# Patient Record
Sex: Male | Born: 1985 | Race: White | Hispanic: No | Marital: Single | Smoking: Never smoker
Health system: Southern US, Community
[De-identification: ages and names within clinical notes are randomized; demographics above are authoritative.]

---

## 2017-04-24 NOTE — Progress Notes (Signed)
Subjective:    Patient ID: Caleb Frye, male    DOB: 02/28/1986, 32 y.o.   MRN: [id number]  Chief Complaint  Patient presents with  . New Patient (Initial Visit)    no appetite and weak all the time    HPI:  Caleb Frye is a 32 y.o. male who presents today for initial evaluation and treatment of Hepatitis C.   Caleb Frye tested positive for Hepatitis C with a positive antibody test on 11/10/2016. Previously tested positive about 5 years ago and did not seek treatment secondary to insurance. 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 He has spent time in prison recently. Available lab work did not include a RNA level or gentotype. He has not sought any treatment prior to today. His mother has been diagnosed with cirrhosis secondary to alcohol. No personal history of liver issues. Immunization status for Hepatitis A and B is currently unknown. He currently works as a Psychologist, occupational. Denies any tattoos.    No Known Allergies    Outpatient Medications Prior to Visit  Medication Sig Dispense Refill  . Ascorbic Acid (VITAMIN C) 100 MG tablet Take 100 mg by mouth daily.    Marland Kitchen b complex vitamins tablet Take 1 tablet by mouth daily.    . Multiple Vitamin (MULTIVITAMIN) capsule Take 1 capsule by mouth daily.     No facility-administered medications prior to visit.      History reviewed. No pertinent past medical history.    History reviewed. No pertinent surgical history.    Family History  Problem Relation Age of Onset  . Bipolar disorder Mother   . Hypertension Father   . Hypertension Paternal Grandmother       Social History   Socioeconomic History  . Marital status: Single    Spouse name: Not on file  . Number of children: 0  . Years of education: 18  . Highest education level: Not on file  Social Needs  . Financial resource strain: Not on file  . Food insecurity - worry: Not on file  . Food insecurity - inability: Not on file  . Transportation needs - medical:  Not on file  . Transportation needs - non-medical: Not on file  Occupational History  . Not on file  Tobacco Use  . Smoking status: Never Smoker  . Smokeless tobacco: Never Used  Substance and Sexual Activity  . Alcohol use: No    Frequency: Never  . Drug use: Yes    Frequency: 7.0 times per week    Types: Marijuana  . Sexual activity: Yes    Comment: given condoms  Other Topics Concern  . Not on file  Social History Narrative  . Not on file      Review of Systems  Constitutional: Positive for unexpected weight change. Negative for chills and fever.  Respiratory: Negative for chest tightness, shortness of breath and wheezing.   Cardiovascular: Negative for chest pain and leg swelling.  Gastrointestinal: Negative for abdominal distention, abdominal pain, blood in stool, constipation, diarrhea, nausea and vomiting.  Genitourinary: Negative for dysuria, flank pain, frequency, genital sores, hematuria, penile pain, penile swelling, scrotal swelling, testicular pain and urgency.        Objective:    BP 137/84   Pulse 92   Temp 98.1 F (36.7 C) (Oral)   Ht 5\' 11"  (1.803 m)   Wt 156 lb 12 oz (71.1 kg)   BMI 21.86 kg/m  Nursing note and vital signs reviewed.  Physical Exam  Constitutional:  He is oriented to person, place, and time. He appears well-developed and well-nourished. No distress.  HENT:  Mouth/Throat: Oropharynx is clear and moist.  Neck: Neck supple.  Cardiovascular: Normal rate, regular rhythm, normal heart sounds and intact distal pulses. Exam reveals no gallop and no friction rub.  No murmur heard. Pulmonary/Chest: Effort normal and breath sounds normal. No respiratory distress. He has no wheezes. He has no rales. He exhibits no tenderness.  Abdominal: Soft. Bowel sounds are normal. He exhibits no distension and no mass. There is no tenderness. There is no rebound and no guarding.  Lymphadenopathy:    He has no cervical adenopathy.  Neurological: He is  alert and oriented to person, place, and time.  Skin: Skin is warm and dry.  Psychiatric: His behavior is normal. Judgment and thought content normal. His mood appears anxious.        Assessment & Plan:   Problem List Items Addressed This Visit      Digestive   Chronic hepatitis C without hepatic coma (HCC) - Primary    Noted to have a positive Hepatitis C Ab 5 years ago with known history of IVDU. Recently releaased from prison. Discussed transmission, lab testing and plan for treatment. Will obtain Hepatitis C blood work for genotype and and RNA levels. Plan to treat with either Epclusa or Harvoni. Awaiting elastography and Fibrosure testing to determine cirrhosis if present. Advised not to share razors or toothbrushes. Follow up pending blood work and imaging results.       Relevant Orders   Hepatitis C genotype   Hepatitis C RNA quantitative   Liver Fibrosis, FibroTest-ActiTest   US ABDOMEN COMPLETE W/ELASTOGRAPHY   Hepatitis B e antibody   Hepatitis B e antigen   Hepatitis B surface antibody   Hepatitis B surface antigen   Hepatitis A Ab, Total   INR/PT   Comprehensive metabolic panel   HIV antibody   RPR   Hepatitis B core antibody, IgM     Other   Anxiety    Caleb Frye appears anxious throughout the office visit. Would recommend to establish with primary care or psychology to help control his symptoms. Resources provided to patient. He has no signs of psychosis or does not appear to be in acute stress          I am having Caleb Frye maintain his vitamin C, b complex vitamins, and multivitamin.   Follow-up:  Pending blood work and imaging results.    Jeanine LuzGregory Cathie Bonnell, FNP [facility]

## 2017-04-25 ENCOUNTER — Encounter: Payer: Self-pay | Admitting: Family

## 2017-04-25 ENCOUNTER — Ambulatory Visit (INDEPENDENT_AMBULATORY_CARE_PROVIDER_SITE_OTHER): Payer: 59 | Admitting: Family

## 2017-04-25 VITALS — BP 137/84 | HR 92 | Temp 98.1°F | Ht 71.0 in | Wt 156.8 lb

## 2017-04-25 DIAGNOSIS — F419 Anxiety disorder, unspecified: Secondary | ICD-10-CM | POA: Diagnosis not present

## 2017-04-25 DIAGNOSIS — B182 Chronic viral hepatitis C: Secondary | ICD-10-CM | POA: Insufficient documentation

## 2017-04-25 NOTE — Patient Instructions (Signed)
Nice to meet you.  Check out the CDC site regarding information on Hepatitis C.   We will check for STD's today as well.   Recommend follow up with primary care for anxiety and possible depression.   We will follow up once we get your lab work and imaging results.

## 2017-04-25 NOTE — Assessment & Plan Note (Signed)
Noted to have a positive Hepatitis C Ab 5 years ago with known history of IVDU. Recently releaased from prison. Discussed transmission, lab testing and plan for treatment. Will obtain Hepatitis C blood work for genotype and and RNA levels. Plan to treat with either Epclusa or Harvoni. Awaiting elastography and Fibrosure testing to determine cirrhosis if present. Advised not to share razors or toothbrushes. Follow up pending blood work and imaging results.

## 2017-04-25 NOTE — Assessment & Plan Note (Signed)
Caleb Frye appears anxious throughout the office visit. Would recommend to establish with primary care or psychology to help control his symptoms. Resources provided to patient. He has no signs of psychosis or does not appear to be in acute stress

## 2017-04-26 LAB — TIQ-NTM

## 2017-04-28 LAB — COMPREHENSIVE METABOLIC PANEL
AG RATIO: 1.6 (calc) (ref 1.0–2.5)
ALT: 81 U/L — AB (ref 9–46)
AST: 38 U/L (ref 10–40)
Albumin: 4.5 g/dL (ref 3.6–5.1)
Alkaline phosphatase (APISO): 64 U/L (ref 40–115)
BUN: 22 mg/dL (ref 7–25)
CO2: 28 mmol/L (ref 20–32)
CREATININE: 1.07 mg/dL (ref 0.60–1.35)
Calcium: 9.4 mg/dL (ref 8.6–10.3)
Chloride: 104 mmol/L (ref 98–110)
GLUCOSE: 91 mg/dL (ref 65–99)
Globulin: 2.9 g/dL (calc) (ref 1.9–3.7)
Potassium: 4 mmol/L (ref 3.5–5.3)
SODIUM: 139 mmol/L (ref 135–146)
TOTAL PROTEIN: 7.4 g/dL (ref 6.1–8.1)
Total Bilirubin: 0.7 mg/dL (ref 0.2–1.2)

## 2017-04-28 LAB — HEPATITIS C RNA QUANTITATIVE
HCV Quantitative Log: 6.14 Log IU/mL — ABNORMAL HIGH
HCV RNA, PCR, QN: 1380000 IU/mL — ABNORMAL HIGH

## 2017-04-28 LAB — HIV ANTIBODY (ROUTINE TESTING W REFLEX): HIV 1&2 Ab, 4th Generation: NONREACTIVE

## 2017-04-28 LAB — LIVER FIBROSIS, FIBROTEST-ACTITEST
ALT: 80 U/L — AB (ref 9–46)
APOLIPOPROTEIN A1: 133 mg/dL (ref 94–176)
Alpha-2-Macroglobulin: 173 mg/dL (ref 106–279)
Bilirubin: 0.5 mg/dL (ref 0.2–1.2)
Fibrosis Score: 0.11
GGT: 16 U/L (ref 3–90)
Haptoglobin: 117 mg/dL (ref 43–212)
Necroinflammat ACT Score: 0.4
Reference ID: 2343232

## 2017-04-28 LAB — HEPATITIS B E ANTIGEN: Hep B E Ag: NONREACTIVE

## 2017-04-28 LAB — HEPATITIS A ANTIBODY, TOTAL: HEPATITIS A AB,TOTAL: NONREACTIVE

## 2017-04-28 LAB — HEPATITIS B SURFACE ANTIBODY,QUALITATIVE: Hep B S Ab: NONREACTIVE

## 2017-04-28 LAB — PROTIME-INR
INR: 1
PROTHROMBIN TIME: 10.4 s (ref 9.0–11.5)

## 2017-04-28 LAB — HEPATITIS B CORE ANTIBODY, IGM: HEP B C IGM: NONREACTIVE

## 2017-04-28 LAB — HEPATITIS B SURFACE ANTIGEN: HEP B S AG: NONREACTIVE

## 2017-04-28 LAB — HEPATITIS B E ANTIBODY: HEP B E AB: NONREACTIVE

## 2017-04-28 LAB — HEPATITIS C GENOTYPE: HCV Genotype: 2

## 2017-04-28 LAB — RPR: RPR: NONREACTIVE

## 2017-05-01 ENCOUNTER — Telehealth: Payer: Self-pay | Admitting: *Deleted

## 2017-05-01 ENCOUNTER — Ambulatory Visit (HOSPITAL_COMMUNITY)
Admission: RE | Admit: 2017-05-01 | Discharge: 2017-05-01 | Disposition: A | Discharge status: Home / Self Care | Payer: 59 | Source: Ambulatory Visit | Attending: Family | Admitting: Family

## 2017-05-01 ENCOUNTER — Other Ambulatory Visit: Payer: Self-pay | Admitting: Family

## 2017-05-01 DIAGNOSIS — K824 Cholesterolosis of gallbladder: Secondary | ICD-10-CM | POA: Insufficient documentation

## 2017-05-01 DIAGNOSIS — B182 Chronic viral hepatitis C: Secondary | ICD-10-CM | POA: Diagnosis not present

## 2017-05-01 DIAGNOSIS — R161 Splenomegaly, not elsewhere classified: Secondary | ICD-10-CM | POA: Insufficient documentation

## 2017-05-01 MED ORDER — SOFOSBUVIR-VELPATASVIR 400-100 MG PO TABS: 1.0000 | ORAL_TABLET | Freq: Every day | ORAL | 2 refills | Status: DC

## 2017-05-01 NOTE — Telephone Encounter (Signed)
-----   Message from Veryl SpeakGregory D Calone, FNP sent at 05/01/2017 12:29 PM EST ----- Please inform patient that he has a moderate amount of fibrosis on his blood work and ultrasound. We are in the process of approving Epclusa to start treatment and will be awaiting insurance authorization. The plan will be to follow up in 1 month following the start of medication and then 6 months after the completion of medication.

## 2017-05-01 NOTE — Progress Notes (Signed)
Received Fibrosure and elastography indicating F2 liver score. Given Genotype 2 will plan to treat with Epclusa for 12 weeks.

## 2017-05-01 NOTE — Telephone Encounter (Signed)
RN called patient, left voicemail asking him to call back and speak with a nurse regarding lab and imaging results.  Andree CossHowell, Leighton Luster M, RN    ______________________________________________ Notes recorded by Veryl Speakalone, Gregory D, FNP on 05/01/2017 at 12:29 PM EST Please inform patient that he has a moderate amount of fibrosis on his blood work and ultrasound. We are in the process of approving Epclusa to start treatment and will be awaiting insurance authorization. The plan will be to follow up in 1 month following the start of medication and then 6 months after the completion of medication. ------  Notes recorded by Veryl Speakalone, Gregory D, FNP on 05/01/2017 at 9:08 AM EST Please inform patient that his HIV and Hepatitis B lab work were negative. We would recommend receiving the Hepatitis B vaccination to help protect him as he is currently not immune. Otherwise his Genotype was 2 and his STD lab work was negative. We will plan to await the results of his ultrasound to determine treatment.

## 2017-05-02 NOTE — Telephone Encounter (Signed)
Patient left message in triage returning this nurse's call.  RN called him back, patient was unable to speak but said he got the results and had no questions. Andree CossHowell, Teffany Blaszczyk M, RN

## 2017-05-04 ENCOUNTER — Other Ambulatory Visit: Payer: Self-pay | Admitting: Pharmacist

## 2017-05-04 DIAGNOSIS — B182 Chronic viral hepatitis C: Secondary | ICD-10-CM

## 2017-05-04 MED ORDER — SOFOSBUVIR-VELPATASVIR 400-100 MG PO TABS: 1.0000 | ORAL_TABLET | Freq: Every day | ORAL | 2 refills | Status: DC

## 2017-05-09 ENCOUNTER — Other Ambulatory Visit: Payer: Self-pay | Admitting: Pharmacist

## 2017-05-09 DIAGNOSIS — B182 Chronic viral hepatitis C: Secondary | ICD-10-CM

## 2017-05-09 MED ORDER — SOFOSBUVIR-VELPATASVIR 400-100 MG PO TABS: 1.0000 | ORAL_TABLET | Freq: Every day | ORAL | 2 refills | Status: DC

## 2017-07-03 ENCOUNTER — Telehealth: Payer: Self-pay

## 2017-07-03 NOTE — Telephone Encounter (Signed)
Patient called wanting the name of the person that prescribed his Hep C medication. He had his bottle but was unsure of the name on it. This nurse confirmed that is was Cassie that prescribed the medication and this patient was satisfied. Towanda Octave, LPN

## 2017-07-05 ENCOUNTER — Encounter: Payer: Self-pay | Admitting: Pharmacy Technician

## 2017-07-17 ENCOUNTER — Telehealth: Payer: Self-pay | Admitting: Pharmacy Technician

## 2017-07-17 NOTE — Telephone Encounter (Signed)
I communicated with Shanda Bumps, RN at [facility], letting her know that [facility] will not be able to provide the final month of Epclusa to Mr. Janee Morn.(he had employment and insurance for the first 2 months of medication, became incarcerated, lost his job and insurance, therefore there was an attempt at getting the final month through patient assistance).  [facility] cannot supply medications to any state run facility, per Idelle Crouch.  Mr. Rahming will be moving from [facility] into a 90 day program which is also state run.  Cassie Kuppelweiser consulted with Mr. Luciana Axe in clinic today, and it was decided that completing the 2 months of Epclusa and stopping was the best route, versus trying to substitute another medication for the final month.  I asked Shanda Bumps to communicate to Mr. Boomershine that when he finishes the 90 day program to come to the clinic for lab work to see if he is cured.  If he is not, we will work with him to get retreatment.

## 2017-07-18 ENCOUNTER — Encounter: Payer: Self-pay | Admitting: Pharmacy Technician

## 2019-04-25 IMAGING — US US ABDOMEN COMPLETE W/ ELASTOGRAPHY
1 series · 13 of 25 positions shown · non-contrast
Comparison: None.

CLINICAL DATA: Chronic hepatitis C



[Series 1: us abdomen complete w/ elastography · 0.22mm/px · 13 of 88 slices shown]
[im 1/88]
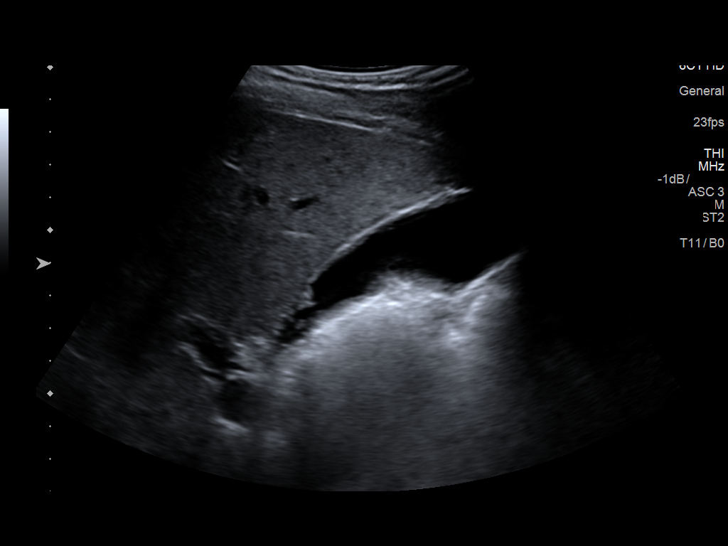
[im 8/88]
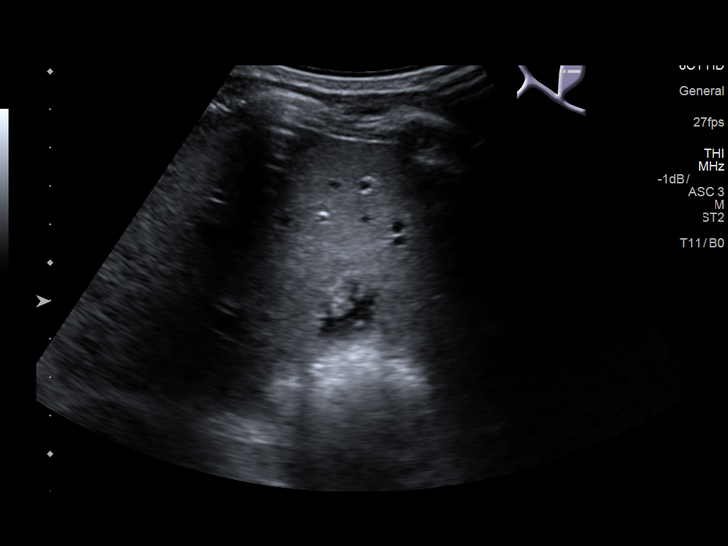
[im 15/88]
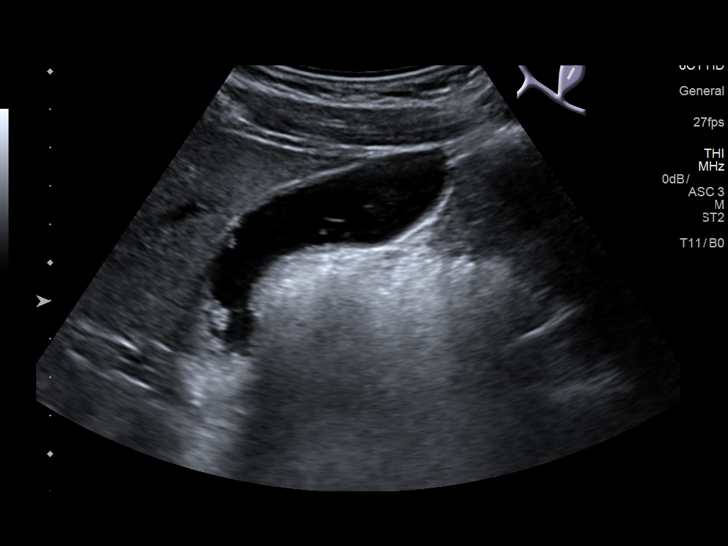
[im 22/88]
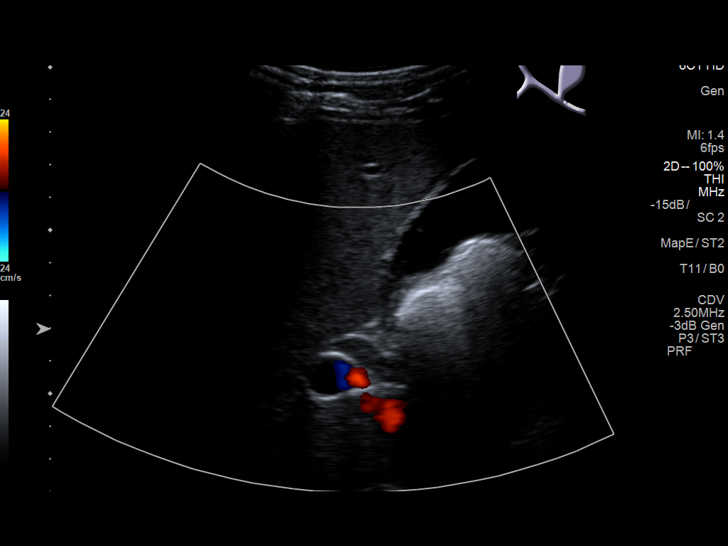
[im 30/88]
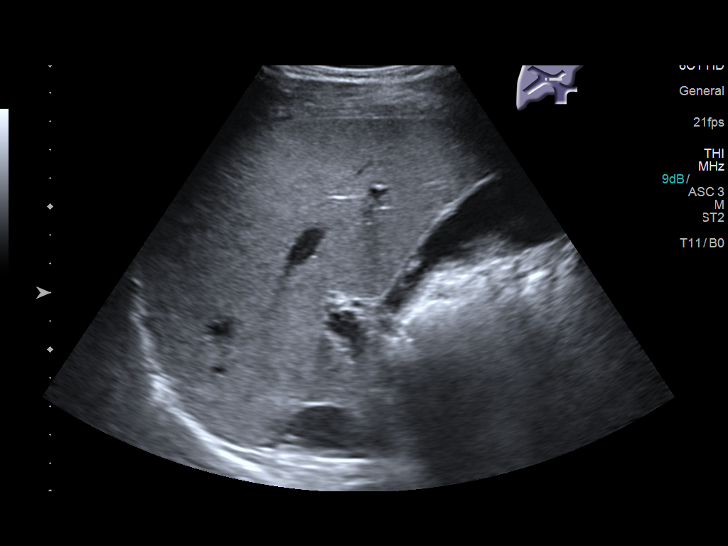
[im 37/88]
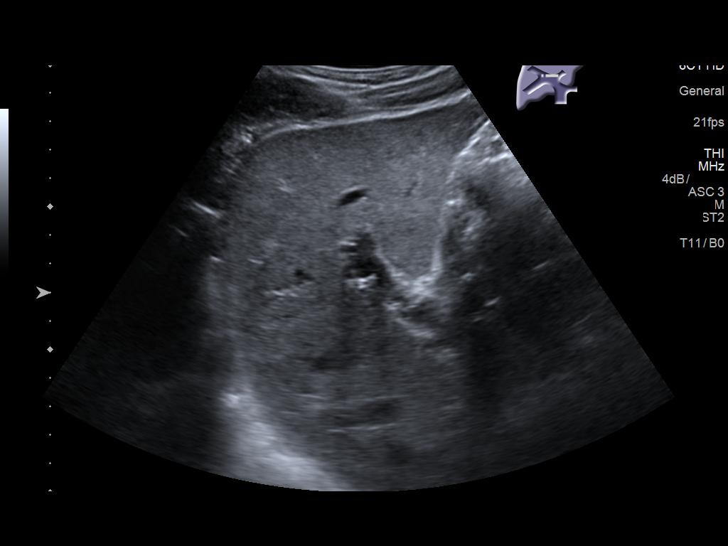
[im 44/88]
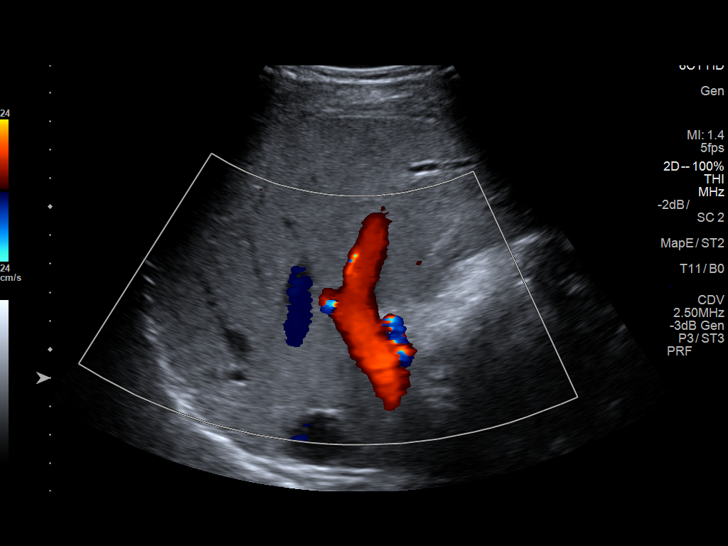
[im 51/88]
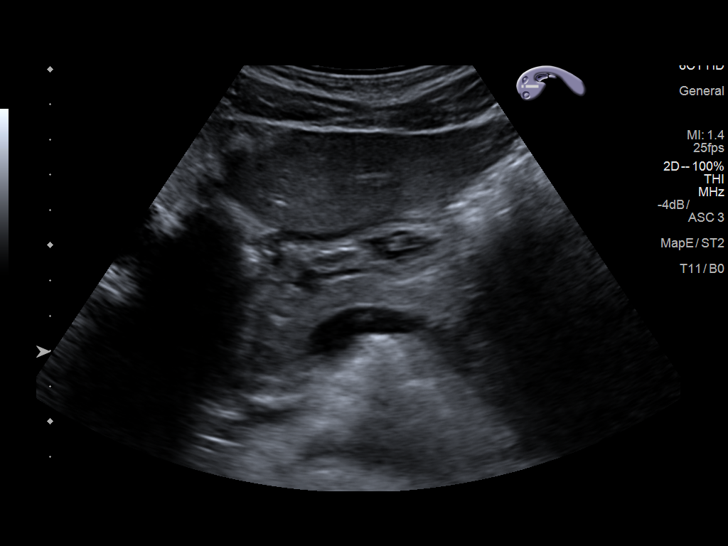
[im 59/88]
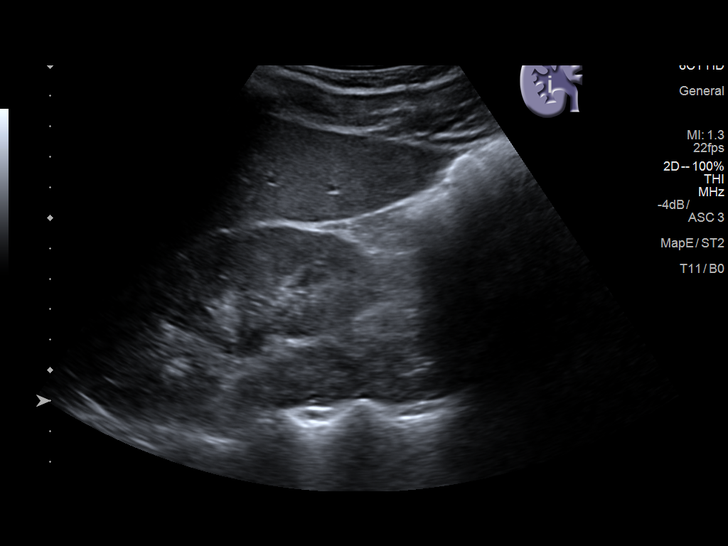
[im 66/88]
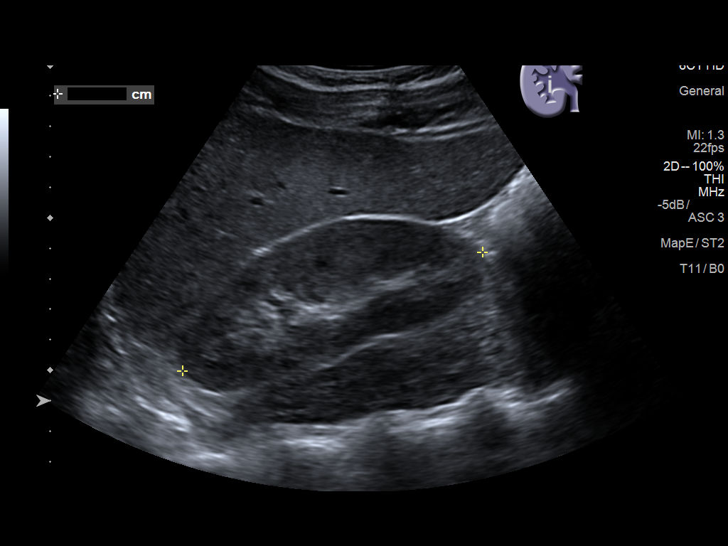
[im 73/88]
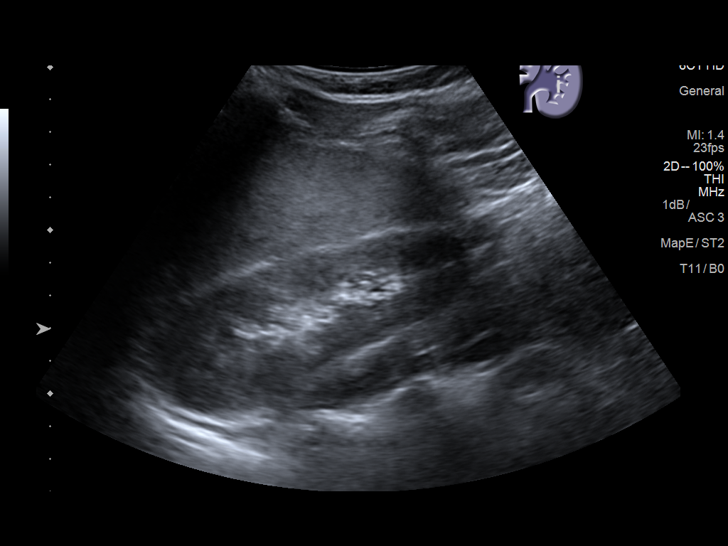
[im 80/88]
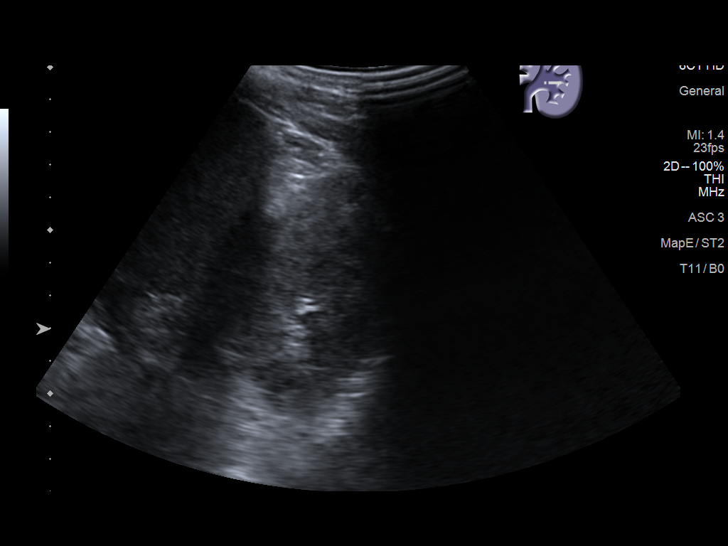
[im 88/88]
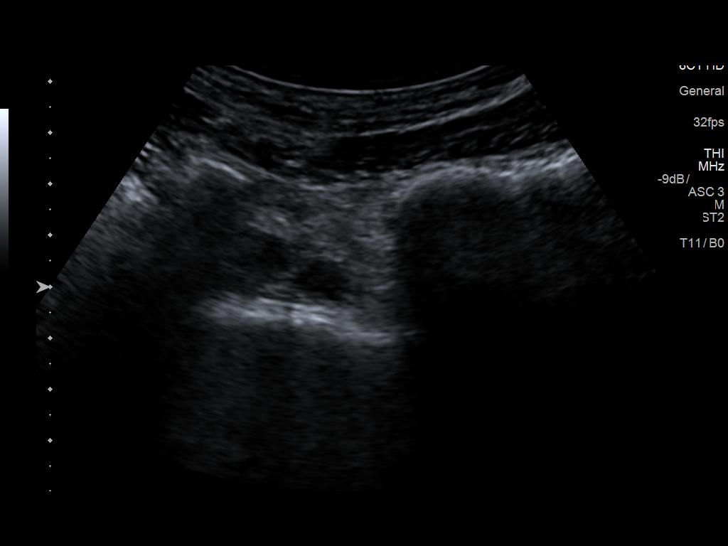

[13 of 25 positions shown; findings below may reference images not displayed]

FINDINGS: ULTRASOUND ABDOMEN

Gallbladder: Gallbladder polyps measuring up to 7 mm. No gallbladder
wall thickening or pericholecystic fluid.

Common bile duct: Diameter: 6 mm

Liver: At the upper limits normal for parenchymal echogenicity. No
focal hepatic lesion is seen. Portal vein is patent on color Doppler
imaging with normal direction of blood flow towards the liver.

IVC: No abnormality visualized.

Pancreas: Visualized portion unremarkable.

Spleen: Enlarged, measuring 14.4 x 15.4 x 6.0 cm (calculated volume
699 mL).

Right Kidney: Length: 11.0 cm.  No mass or hydronephrosis.

Left Kidney: Length: 10.6 cm.  No mass or hydronephrosis.

Abdominal aorta: No aneurysm visualized.

Other findings: None.

ULTRASOUND HEPATIC ELASTOGRAPHY

Device: Siemens Helix VTQ

Patient position: Supine

Transducer 6C1

Number of measurements: 10

Hepatic segment:  8

Median velocity:   1.44 m/sec

IQR:

IQR/Median velocity ratio:

Corresponding Metavir fibrosis score:  F2 + some F3

Risk of fibrosis: Moderate

Limitations of exam: None

Please note that abnormal shear wave velocities may also be
identified in clinical settings other than with hepatic fibrosis,
such as: acute hepatitis, elevated right heart and central venous
pressures including use of beta blockers, Utada disease
(Cphamandla), infiltrative processes such as
mastocytosis/amyloidosis/infiltrative tumor, extrahepatic
cholestasis, in the post-prandial state, and liver transplantation.
Correlation with patient history, laboratory data, and clinical
condition recommended.
IMPRESSION: ULTRASOUND ABDOMEN:
No focal hepatic lesion is seen.

Splenomegaly, suggesting portal hypertension. Portal vein is patent.

Gallbladder polyps measuring up to 7 mm. Follow-up ultrasound is
suggested in 1 year. This recommendation follows ACR consensus
guidelines: White Paper of the ACR Incidental Findings Committee II
on Gallbladder and Biliary Findings. [HOSPITAL]
2434:;[DATE].

ULTRASOUND HEPATIC ELASTOGRAPHY:

Median hepatic shear wave velocity is calculated at 1.44 m/sec.

Corresponding Metavir fibrosis score is F2 + some F3.

Risk of fibrosis is Moderate.

Follow-up: Additional testing appropriate

## 2019-04-25 IMAGING — US US ABDOMEN COMPLETE W/ ELASTOGRAPHY
1 series · 13 of 13 positions shown · non-contrast
Comparison: None.

CLINICAL DATA: Chronic hepatitis C



[Series 1: us abdomen complete w/ elastography · 0.15mm/px · 13 of 13 slices shown]
[im 1/13]
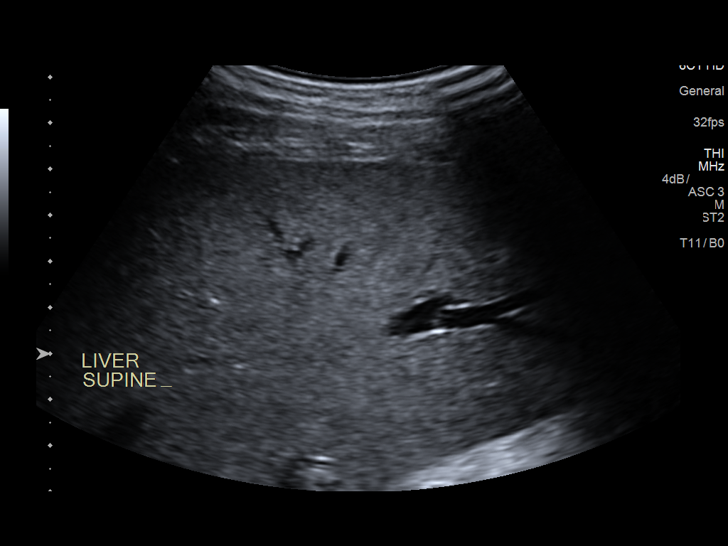
[im 2/13]
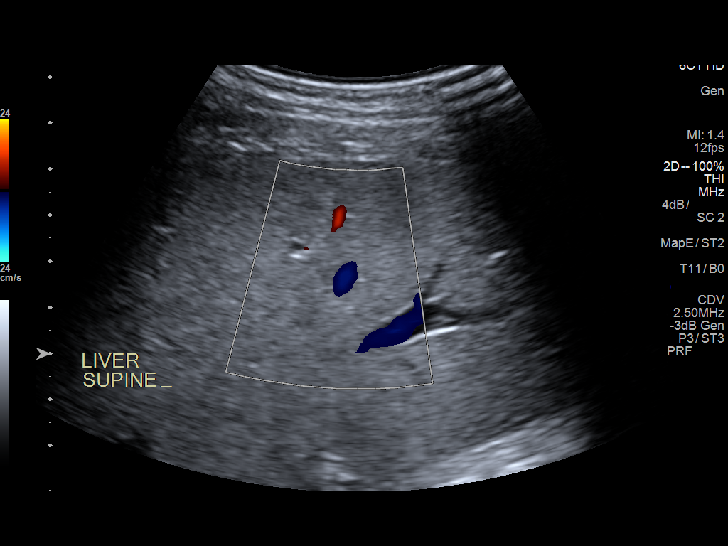
[im 3/13]
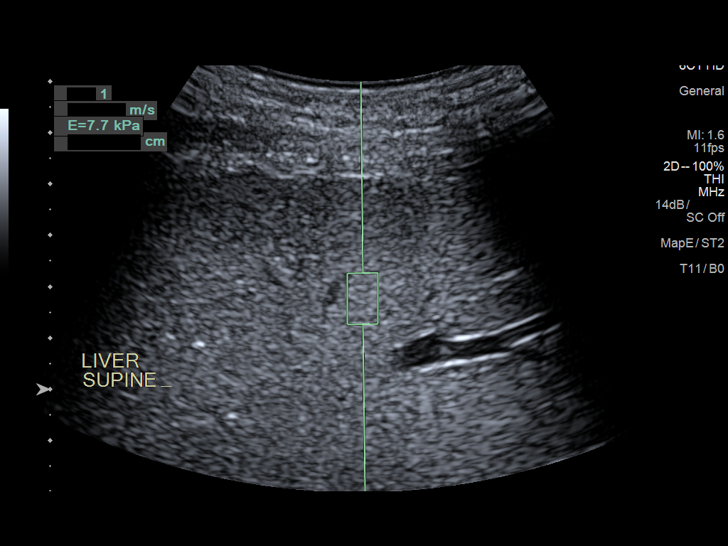
[im 4/13]
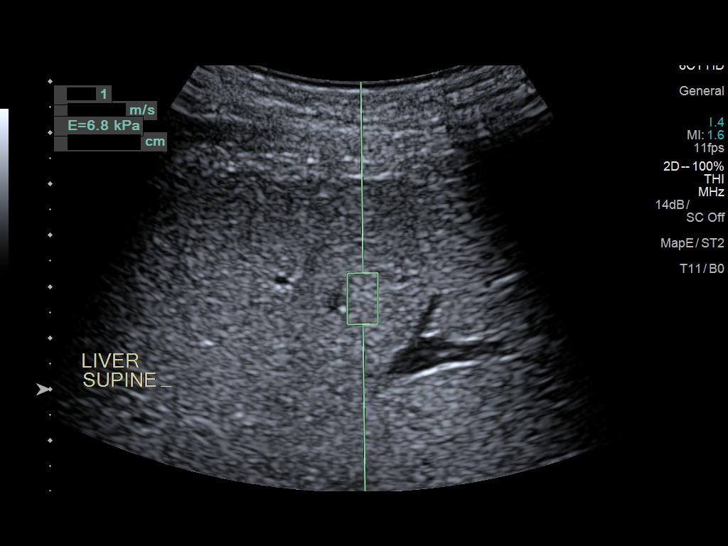
[im 5/13]
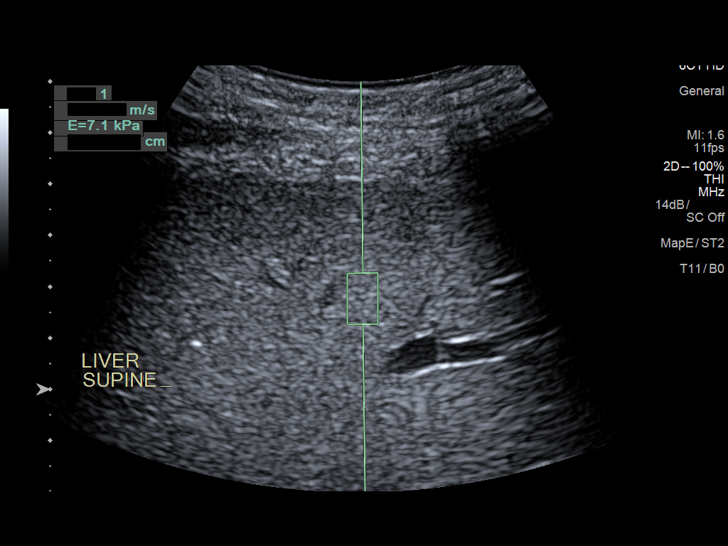
[im 6/13]
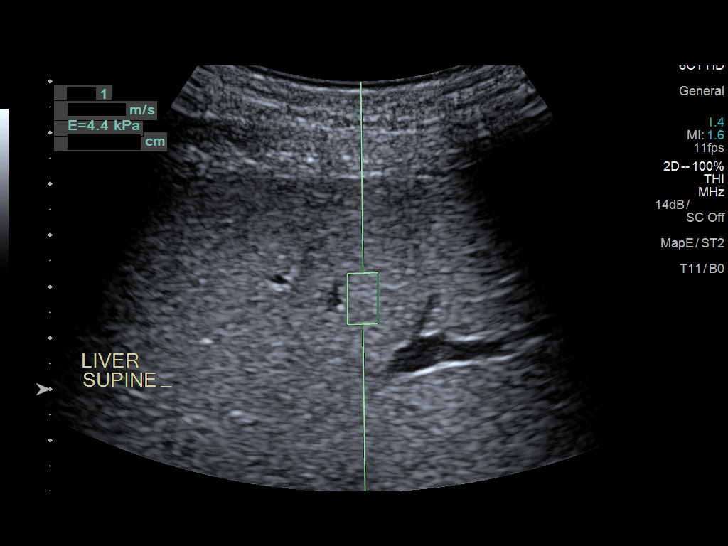
[im 7/13]
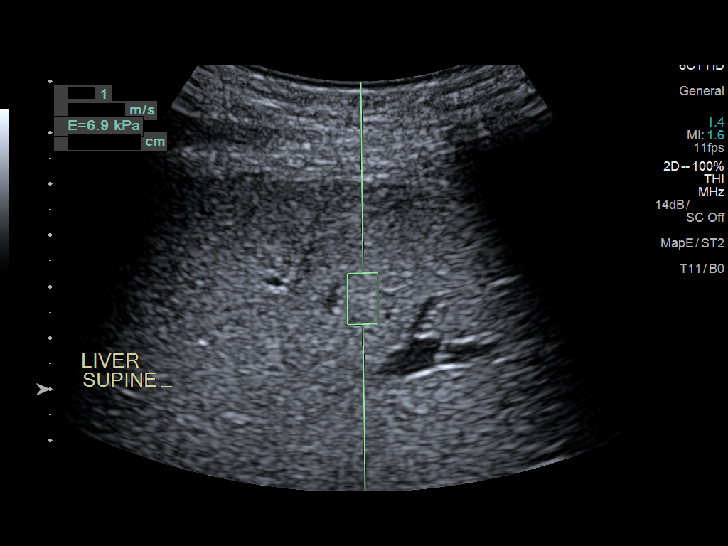
[im 8/13]
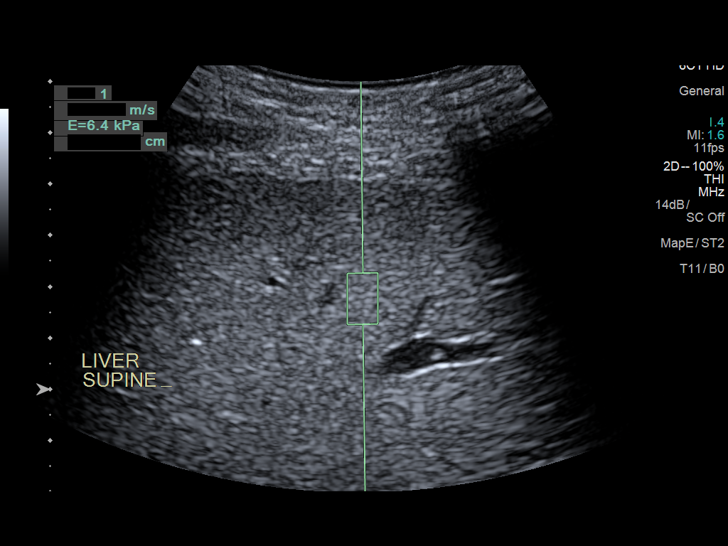
[im 9/13]
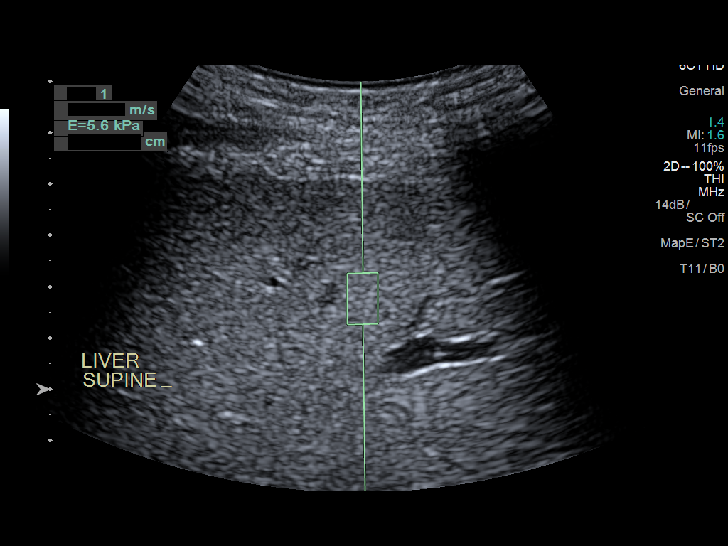
[im 10/13]
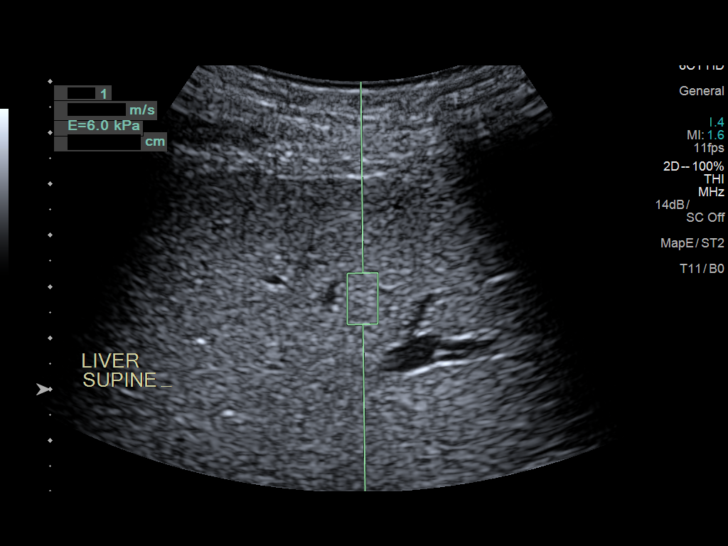
[im 11/13]
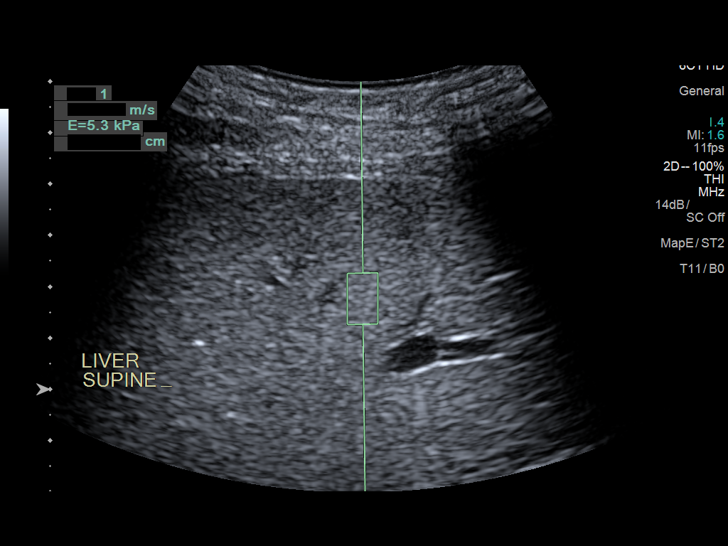
[im 12/13]
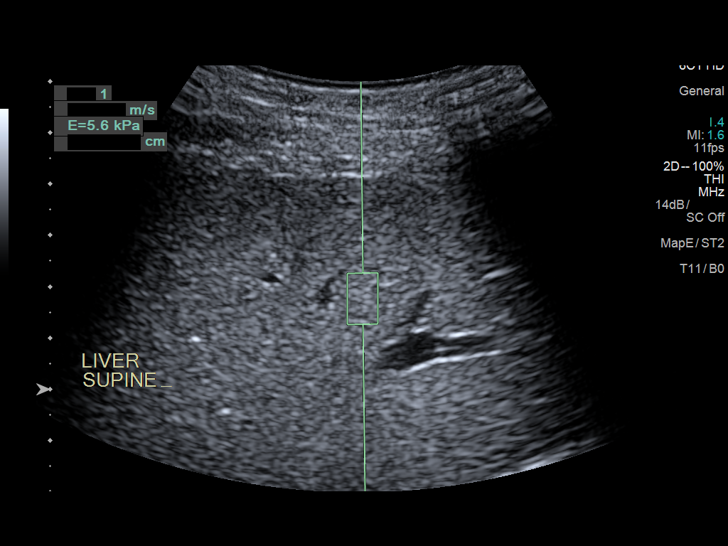
[im 13/13]
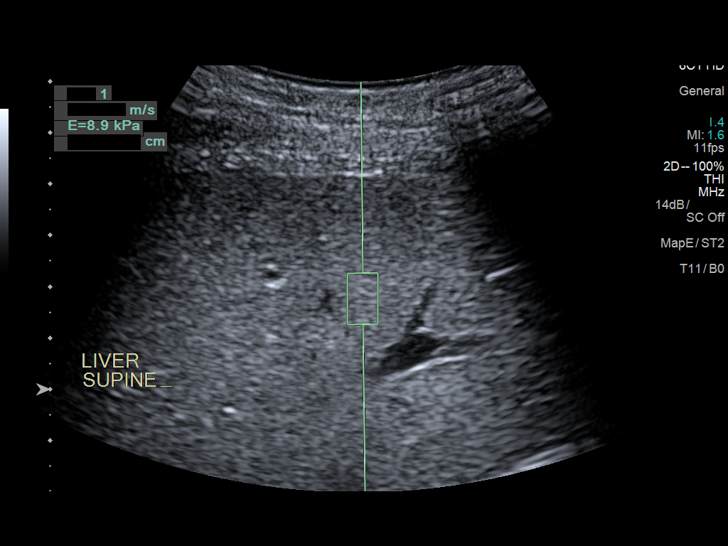

[13 of 13 positions shown; findings below may reference images not displayed]

FINDINGS: ULTRASOUND ABDOMEN

Gallbladder: Gallbladder polyps measuring up to 7 mm. No gallbladder
wall thickening or pericholecystic fluid.

Common bile duct: Diameter: 6 mm

Liver: At the upper limits normal for parenchymal echogenicity. No
focal hepatic lesion is seen. Portal vein is patent on color Doppler
imaging with normal direction of blood flow towards the liver.

IVC: No abnormality visualized.

Pancreas: Visualized portion unremarkable.

Spleen: Enlarged, measuring 14.4 x 15.4 x 6.0 cm (calculated volume
699 mL).

Right Kidney: Length: 11.0 cm.  No mass or hydronephrosis.

Left Kidney: Length: 10.6 cm.  No mass or hydronephrosis.

Abdominal aorta: No aneurysm visualized.

Other findings: None.

ULTRASOUND HEPATIC ELASTOGRAPHY

Device: Siemens Helix VTQ

Patient position: Supine

Transducer 6C1

Number of measurements: 10

Hepatic segment:  8

Median velocity:   1.44 m/sec

IQR:

IQR/Median velocity ratio:

Corresponding Metavir fibrosis score:  F2 + some F3

Risk of fibrosis: Moderate

Limitations of exam: None

Please note that abnormal shear wave velocities may also be
identified in clinical settings other than with hepatic fibrosis,
such as: acute hepatitis, elevated right heart and central venous
pressures including use of beta blockers, Utada disease
(Cphamandla), infiltrative processes such as
mastocytosis/amyloidosis/infiltrative tumor, extrahepatic
cholestasis, in the post-prandial state, and liver transplantation.
Correlation with patient history, laboratory data, and clinical
condition recommended.
IMPRESSION: ULTRASOUND ABDOMEN:
No focal hepatic lesion is seen.

Splenomegaly, suggesting portal hypertension. Portal vein is patent.

Gallbladder polyps measuring up to 7 mm. Follow-up ultrasound is
suggested in 1 year. This recommendation follows ACR consensus
guidelines: White Paper of the ACR Incidental Findings Committee II
on Gallbladder and Biliary Findings. [HOSPITAL]
2434:;[DATE].

ULTRASOUND HEPATIC ELASTOGRAPHY:

Median hepatic shear wave velocity is calculated at 1.44 m/sec.

Corresponding Metavir fibrosis score is F2 + some F3.

Risk of fibrosis is Moderate.

Follow-up: Additional testing appropriate

## 2021-12-09 ENCOUNTER — Ambulatory Visit: Payer: Self-pay | Admitting: Physician Assistant

## 2021-12-09 ENCOUNTER — Encounter: Payer: Self-pay | Admitting: Physician Assistant

## 2021-12-09 VITALS — BP 128/88 | HR 81 | Temp 97.5°F | Ht 69.0 in | Wt 190.2 lb

## 2021-12-09 DIAGNOSIS — I10 Essential (primary) hypertension: Secondary | ICD-10-CM

## 2021-12-09 DIAGNOSIS — F418 Other specified anxiety disorders: Secondary | ICD-10-CM

## 2021-12-09 MED ORDER — LOSARTAN POTASSIUM 25 MG PO TABS: 25.0000 mg | ORAL_TABLET | Freq: Every day | ORAL | 1 refills | Status: DC

## 2021-12-09 NOTE — Progress Notes (Signed)
New Patient Office Visit  Subjective:  Patient ID: Caleb Frye, male    DOB: September 23, 1985  Age: 36 y.o. MRN: [id number]  CC:  Chief Complaint  Patient presents with   Anxiety   Depression   Hypertension    HPI Caleb Frye presents for complaints of hypertension.  States he has been monitoring bp at home for the past several months and it has been elevted around 140-150/90-100s.  He has never been on medication for bp in past.  Family history is positive for heart disease and high blood pressure.  Pt states occasionally he has a pain in his chest that lasts for only a second or two that can happen at any time but does note it with anxiety and stress.  He has had moderate fatigue as well  Pt complains of depressive symptoms along with anxiety that has been going on for about a year.  Thinks a lot due to fact he lost his mother and grandfather recently.  He is not interested in counseling and actually no medication for symptoms at this time.  Denies suicidal ideations  History reviewed. No pertinent past medical history.  History reviewed. No pertinent surgical history.  Family History  Problem Relation Age of Onset   Hypertension Mother    Diabetes Mother    Bipolar disorder Mother    Hypertension Father     Social History   Socioeconomic History   Marital status: Single    Spouse name: Not on file   Number of children: 0   Years of education: 75   Highest education level: Not on file  Occupational History   Not on file  Tobacco Use   Smoking status: Never   Smokeless tobacco: Never  Vaping Use   Vaping Use: Former  Substance and Sexual Activity   Alcohol use: No   Drug use: Yes    Frequency: 7.0 times per week    Types: Marijuana   Sexual activity: Yes    Comment: given condoms  Other Topics Concern   Not on file  Social History Narrative   Not on file   Social Determinants of Health   Financial Resource Strain: Not on file  Food Insecurity: Not  on file  Transportation Needs: Not on file  Physical Activity: Not on file  Stress: Not on file  Social Connections: Not on file  Intimate Partner Violence: Not on file     Current Outpatient Medications:    losartan (COZAAR) 25 MG tablet, Take 1 tablet (25 mg total) by mouth daily., Disp: 30 tablet, Rfl: 1   No Known Allergies  ROS CONSTITUTIONAL:see HPI  CARDIOVASCULAR: see HPI RESPIRATORY: Negative for recent cough and dyspnea.  GASTROINTESTINAL: Negative for abdominal pain, acid reflux symptoms, constipation, diarrhea, nausea and vomiting.  MSK: Negative for arthralgias and myalgias.  INTEGUMENTARY: Negative for rash.  NEUROLOGICAL: Negative for dizziness and headaches.  PSYCHIATRIC: see HPI       Objective:    PHYSICAL EXAM:   VS: BP 128/88 (BP Location: Right Arm, Cuff Size: Large)   Pulse 81   Temp (!) 97.5 F (36.4 C) (Temporal)   Ht 5\' 9"  (1.753 m)   Wt 190 lb 3.2 oz (86.3 kg)   SpO2 98%   BMI 28.09 kg/m   GEN: Well nourished, well developed, in no acute distress   Cardiac: RRR; no murmurs, rubs, or gallops,no edema -  Respiratory:  normal respiratory rate and pattern with no distress - normal breath sounds with  no rales, rhonchi, wheezes or rubs GI: normal bowel sounds, no masses or tenderness  Psych: euthymic mood, appropriate affect and demeanor     12/09/2021    3:16 PM 04/25/2017    2:33 PM  Depression screen PHQ 2/9  Decreased Interest 0 0  Down, Depressed, Hopeless 2 1  PHQ - 2 Score 2 1  Altered sleeping 3   Tired, decreased energy 3   Change in appetite 3   Feeling bad or failure about yourself  3   Trouble concentrating 3   Moving slowly or fidgety/restless 3   Suicidal thoughts 0   PHQ-9 Score 20   Difficult doing work/chores Very difficult      Health Maintenance Due  Topic Date Due   COVID-19 Vaccine (1) Never done   TETANUS/TDAP  Never done    There are no preventive care reminders to display for this patient.  No  results found for: "TSH" No results found for: "WBC", "HGB", "HCT", "MCV", "PLT" Lab Results  Component Value Date   NA 139 04/25/2017   K 4.0 04/25/2017   CO2 28 04/25/2017   GLUCOSE 91 04/25/2017   BUN 22 04/25/2017   CREATININE 1.07 04/25/2017   BILITOT 0.7 04/25/2017   AST 38 04/25/2017   ALT 80 (H) 04/25/2017   PROT 7.4 04/25/2017   CALCIUM 9.4 04/25/2017   No results found for: "CHOL" No results found for: "HDL" No results found for: "LDLCALC" No results found for: "TRIG" No results found for: "CHOLHDL" No results found for: "HGBA1C"    Assessment & Plan:   Problem List Items Addressed This Visit       Cardiovascular and Mediastinum   Primary hypertension - Primary   Relevant Medications   losartan (COZAAR) 25 MG tablet   Other Relevant Orders   Comprehensive metabolic panel Pt refuses EKG     Other   Depression with anxiety Pt opts for no treatment at this time    Meds ordered this encounter  Medications   losartan (COZAAR) 25 MG tablet    Sig: Take 1 tablet (25 mg total) by mouth daily.    Dispense:  30 tablet    Refill:  1    Order Specific Question:   Supervising Provider    AnswerShelton Silvas    Follow-up: Return in about 2 months (around 02/08/2022) for follow up and 3 weeks for nurse visit bp check.    SARA R Math Brazie, PA-C

## 2021-12-10 LAB — COMPREHENSIVE METABOLIC PANEL
ALT: 84 IU/L — ABNORMAL HIGH (ref 0–44)
AST: 50 IU/L — ABNORMAL HIGH (ref 0–40)
Albumin/Globulin Ratio: 1.7 (ref 1.2–2.2)
Albumin: 4.3 g/dL (ref 4.1–5.1)
Alkaline Phosphatase: 94 IU/L (ref 44–121)
BUN/Creatinine Ratio: 14 (ref 9–20)
BUN: 14 mg/dL (ref 6–20)
Bilirubin Total: 0.3 mg/dL (ref 0.0–1.2)
CO2: 24 mmol/L (ref 20–29)
Calcium: 9.1 mg/dL (ref 8.7–10.2)
Chloride: 101 mmol/L (ref 96–106)
Creatinine, Ser: 1 mg/dL (ref 0.76–1.27)
Globulin, Total: 2.6 g/dL (ref 1.5–4.5)
Glucose: 103 mg/dL — ABNORMAL HIGH (ref 70–99)
Potassium: 3.5 mmol/L (ref 3.5–5.2)
Sodium: 141 mmol/L (ref 134–144)
Total Protein: 6.9 g/dL (ref 6.0–8.5)
eGFR: 101 mL/min/{1.73_m2} (ref >59)

## 2021-12-13 ENCOUNTER — Other Ambulatory Visit: Payer: Self-pay | Admitting: Physician Assistant

## 2021-12-13 ENCOUNTER — Telehealth: Payer: Self-pay

## 2021-12-13 DIAGNOSIS — F418 Other specified anxiety disorders: Secondary | ICD-10-CM

## 2021-12-13 MED ORDER — SERTRALINE HCL 25 MG PO TABS: 25.0000 mg | ORAL_TABLET | Freq: Every day | ORAL | 3 refills | Status: DC

## 2021-12-13 NOTE — Telephone Encounter (Signed)
Patient's Fiance called tiffany kidd (verified she is on his HIPPA FYIS) she stated that the patient has now decided he does want to be on medication for Depression/ Anxiety. She stated that she knew they were here to see you on 10/5 and the patient didn't want to at that point and time, but now she states that he does. She also mentioned that if he was placed on a medication he prefers not to take Wellbutrin. Please advise.

## 2021-12-13 NOTE — Telephone Encounter (Signed)
Patient's Fiance informed.

## 2021-12-30 ENCOUNTER — Ambulatory Visit: Payer: Self-pay

## 2022-02-08 ENCOUNTER — Ambulatory Visit: Payer: Self-pay | Admitting: Physician Assistant

## 2022-04-01 ENCOUNTER — Ambulatory Visit: Payer: Self-pay | Admitting: Physician Assistant

## 2023-09-11 ENCOUNTER — Ambulatory Visit

## 2023-09-11 DIAGNOSIS — F418 Other specified anxiety disorders: Secondary | ICD-10-CM

## 2023-09-11 DIAGNOSIS — I10 Essential (primary) hypertension: Secondary | ICD-10-CM

## 2023-09-11 DIAGNOSIS — M25511 Pain in right shoulder: Secondary | ICD-10-CM

## 2023-09-11 DIAGNOSIS — R0789 Other chest pain: Secondary | ICD-10-CM

## 2023-09-11 MED ORDER — CYCLOBENZAPRINE HCL 10 MG PO TABS: 10.0000 mg | ORAL_TABLET | Freq: Three times a day (TID) | ORAL | 0 refills | Status: AC | PRN

## 2023-09-11 MED ORDER — LOSARTAN POTASSIUM 25 MG PO TABS: 25.0000 mg | ORAL_TABLET | Freq: Every day | ORAL | 1 refills | Status: AC

## 2023-09-11 MED ORDER — SERTRALINE HCL 25 MG PO TABS: 25.0000 mg | ORAL_TABLET | Freq: Every day | ORAL | 1 refills | Status: DC
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
## 2023-09-11 NOTE — Assessment & Plan Note (Signed)
 History of DYSPHORIC MOOD WITH CHRONIC ANXIETY: Scored high on the anxiety and depression questionnaires. Has not been taking his medication. Advised to restart medication to manage symptoms.  - Advise to restart Zoloft  which was previously prescribed. Start taking ZOLOFT  25 MG DAILY.
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
## 2023-09-11 NOTE — Assessment & Plan Note (Signed)
 Blood pressure elevated at 140/90, likely due to pain. Previously on losartan , which he has not taken recently. Advised to restart medication to manage blood pressure. - Prescribe losartan  25 mg tablets - Advise to restart losartan . Sent a refill to his pharmacy
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
## 2023-09-12 ENCOUNTER — Ambulatory Visit: Payer: Self-pay

## 2023-09-12 ENCOUNTER — Ambulatory Visit (INDEPENDENT_AMBULATORY_CARE_PROVIDER_SITE_OTHER)
Admission: RE | Admit: 2023-09-12 | Discharge: 2023-09-12 | Disposition: A | Discharge status: Home / Self Care | Source: Ambulatory Visit

## 2023-09-12 DIAGNOSIS — R0789 Other chest pain: Secondary | ICD-10-CM | POA: Diagnosis not present

## 2023-09-12 DIAGNOSIS — M25511 Pain in right shoulder: Secondary | ICD-10-CM | POA: Diagnosis not present

## 2023-09-12 MED ORDER — OXYCODONE HCL 5 MG PO CAPS
5.0000 mg | ORAL_CAPSULE | Freq: Three times a day (TID) | ORAL | 0 refills | Status: AC | PRN
Start: 2023-09-12 — End: 2023-09-17

## 2023-09-13 ENCOUNTER — Ambulatory Visit: Payer: Self-pay
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
## 2023-09-14 ENCOUNTER — Other Ambulatory Visit: Payer: Self-pay

## 2023-09-14 ENCOUNTER — Ambulatory Visit

## 2023-09-14 DIAGNOSIS — M25511 Pain in right shoulder: Secondary | ICD-10-CM

## 2023-09-15 MED ORDER — HYDROXYZINE PAMOATE 25 MG PO CAPS: 25.0000 mg | ORAL_CAPSULE | Freq: Three times a day (TID) | ORAL | 0 refills | Status: AC | PRN

## 2023-09-18 ENCOUNTER — Encounter: Payer: Self-pay | Admitting: Family Medicine

## 2023-09-18 ENCOUNTER — Ambulatory Visit (INDEPENDENT_AMBULATORY_CARE_PROVIDER_SITE_OTHER): Admitting: Family Medicine

## 2023-09-18 ENCOUNTER — Ambulatory Visit (HOSPITAL_COMMUNITY)
Admission: RE | Admit: 2023-09-18 | Discharge: 2023-09-18 | Disposition: A | Discharge status: Home / Self Care | Source: Ambulatory Visit

## 2023-09-18 VITALS — BP 138/78 | HR 117 | Temp 97.6°F | Resp 16 | Ht 69.0 in | Wt 177.8 lb

## 2023-09-18 DIAGNOSIS — M25511 Pain in right shoulder: Secondary | ICD-10-CM | POA: Insufficient documentation

## 2023-09-18 DIAGNOSIS — R936 Abnormal findings on diagnostic imaging of limbs: Secondary | ICD-10-CM | POA: Diagnosis not present

## 2023-09-18 DIAGNOSIS — F418 Other specified anxiety disorders: Secondary | ICD-10-CM | POA: Diagnosis not present

## 2023-09-18 DIAGNOSIS — S2231XD Fracture of one rib, right side, subsequent encounter for fracture with routine healing: Secondary | ICD-10-CM | POA: Diagnosis not present

## 2023-09-18 DIAGNOSIS — M67813 Other specified disorders of tendon, right shoulder: Secondary | ICD-10-CM | POA: Diagnosis not present

## 2023-09-18 DIAGNOSIS — M19011 Primary osteoarthritis, right shoulder: Secondary | ICD-10-CM | POA: Insufficient documentation

## 2023-09-18 DIAGNOSIS — S46011A Strain of muscle(s) and tendon(s) of the rotator cuff of right shoulder, initial encounter: Secondary | ICD-10-CM | POA: Insufficient documentation

## 2023-09-18 DIAGNOSIS — R102 Pelvic and perineal pain: Secondary | ICD-10-CM | POA: Insufficient documentation

## 2023-09-18 MED ORDER — OXYCODONE HCL 5 MG PO TABS: 5.0000 mg | ORAL_TABLET | ORAL | 0 refills | Status: AC | PRN

## 2023-09-18 MED ORDER — IBUPROFEN 800 MG PO TABS: 800.0000 mg | ORAL_TABLET | Freq: Three times a day (TID) | ORAL | 0 refills | Status: DC
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
## 2023-09-18 NOTE — Patient Instructions (Signed)
 Managing Pain Without Opioids Opioids are strong medicines used to treat moderate to severe pain. For some people, especially those who have long-term (chronic) pain, opioids may not be the best choice for pain management due to: Side effects like nausea, constipation, and sleepiness. The risk of addiction (opioid use disorder). The longer you take opioids, the greater your risk of addiction. Pain that lasts for more than 3 months is called chronic pain. Managing chronic pain usually requires more than one approach and is often provided by a team of health care providers working together (multidisciplinary approach). Pain management may be done at a pain management center or pain clinic. How to manage pain without the use of opioids Use non-opioid medicines Non-opioid medicines for pain may include: Over-the-counter or prescription non-steroidal anti-inflammatory drugs (NSAIDs). These may be the first medicines used for pain. They work well for muscle and bone pain, and they reduce swelling. Acetaminophen. This over-the-counter medicine may work well for milder pain but not swelling. Antidepressants. These may be used to treat chronic pain. A certain type of antidepressant (tricyclics) is often used. These medicines are given in lower doses for pain than when used for depression. Anticonvulsants. These are usually used to treat seizures but may also reduce nerve (neuropathic) pain. Muscle relaxants. These relieve pain caused by sudden muscle tightening (spasms). You may also use a pain medicine that is applied to the skin as a patch, cream, or gel (topical analgesic), such as a numbing medicine. These may cause fewer side effects than medicines taken by mouth. Do certain therapies as directed Some therapies can help with pain management. They include: Physical therapy. You will do exercises to gain strength and flexibility. A physical therapist may teach you exercises to move and stretch parts of  your body that are weak, stiff, or painful. You can learn these exercises at physical therapy visits and practice them at home. Physical therapy may also involve: Massage. Heat wraps or applying heat or cold to affected areas. Electrical signals that interrupt pain signals (transcutaneous electrical nerve stimulation, TENS). Weak lasers that reduce pain and swelling (low-level laser therapy). Signals from your body that help you learn to regulate pain (biofeedback). Occupational therapy. This helps you to learn ways to function at home and work with less pain. Recreational therapy. This involves trying new activities or hobbies, such as a physical activity or drawing. Mental health therapy, including: Cognitive behavioral therapy (CBT). This helps you learn coping skills for dealing with pain. Acceptance and commitment therapy (ACT) to change the way you think and react to pain. Relaxation therapies, including muscle relaxation exercises and mindfulness-based stress reduction. Pain management counseling. This may be individual, family, or group counseling.  Receive medical treatments Medical treatments for pain management include: Nerve block injections. These may include a pain blocker and anti-inflammatory medicines. You may have injections: Near the spine to relieve chronic back or neck pain. Into joints to relieve back or joint pain. Into nerve areas that supply a painful area to relieve body pain. Into muscles (trigger point injections) to relieve some painful muscle conditions. A medical device placed near your spine to help block pain signals and relieve nerve pain or chronic back pain (spinal cord stimulation device). Acupuncture. Follow these instructions at home Medicines Take over-the-counter and prescription medicines only as told by your health care provider. If you are taking pain medicine, ask your health care providers about possible side effects to watch out for. Do not  drive or use heavy machinery  while taking prescription opioid pain medicine. Lifestyle  Do not use drugs or alcohol to reduce pain. If you drink alcohol, limit how much you have to: 0-1 drink a day for women who are not pregnant. 0-2 drinks a day for men. Know how much alcohol is in a drink. In the [place] one drink equals one 12 oz bottle of beer (355 mL), one 5 oz glass of wine (148 mL), or one 1 oz glass of hard liquor (44 mL). Do not use any products that contain nicotine or tobacco. These products include cigarettes, chewing tobacco, and vaping devices, such as e-cigarettes. If you need help quitting, ask your health care provider. Eat a healthy diet and maintain a healthy weight. Poor diet and excess weight may make pain worse. Eat foods that are high in fiber. These include fresh fruits and vegetables, whole grains, and beans. Limit foods that are high in fat and processed sugars, such as fried and sweet foods. Exercise regularly. Exercise lowers stress and may help relieve pain. Ask your health care provider what activities and exercises are safe for you. If your health care provider approves, join an exercise class that combines movement and stress reduction. Examples include yoga and tai chi. Get enough sleep. Lack of sleep may make pain worse. Lower stress as much as possible. Practice stress reduction techniques as told by your therapist. General instructions Work with all your pain management providers to find the treatments that work best for you. You are an important member of your pain management team. There are many things you can do to reduce pain on your own. Consider joining an online or in-person support group for people who have chronic pain. Keep all follow-up visits. This is important. Where to find more information You can find more information about managing pain without opioids from: American Academy of Pain Medicine: painmed.org Institute for Chronic Pain:  instituteforchronicpain.org American Chronic Pain Association: theacpa.org Contact a health care provider if: You have side effects from pain medicine. Your pain gets worse or does not get better with treatments or home therapy. You are struggling with anxiety or depression. Summary Many types of pain can be managed without opioids. Chronic pain may respond better to pain management without opioids. Pain is best managed when you and a team of health care providers work together. Pain management without opioids may include non-opioid medicines, medical treatments, physical therapy, mental health therapy, and lifestyle changes. Tell your health care providers if your pain gets worse or is not being managed well enough. This information is not intended to replace advice given to you by your health care provider. Make sure you discuss any questions you have with your health care provider. Document Revised: 06/03/2020 Document Reviewed: 06/03/2020 Elsevier Patient Education  2024 [employer].

## 2023-09-18 NOTE — Assessment & Plan Note (Signed)
 History of DYSPHORIC MOOD WITH CHRONIC ANXIETY: Scored high on the anxiety and depression questionnaires.   Difficulty focusing and anxiety, possibly worsened by injuries and pain management. Referral discussed for klonopin medication that was taken in the past. Nothing on file in chart.  - Send referral to psychiatry provider in [place]. - Continue ZOLOFT  25 MG DAILY.
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
## 2023-09-19 ENCOUNTER — Ambulatory Visit: Payer: Self-pay

## 2023-09-25 ENCOUNTER — Telehealth

## 2023-09-25 NOTE — Telephone Encounter (Signed)
 Copied from CRM [id number]. Topic: General - Other >> Sep 22, 2023  2:06 PM Larissa S wrote: Reason for CRM: Requita with [facility] calling to inform provider that patient has been scheduled for intake appointment for counseling.  Callback # [phone]

## 2023-09-25 NOTE — Telephone Encounter (Signed)
 Spoke with Caleb Frye, requested she fax over a form requesting this information and we will provide what is needed and fax back.
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
## 2023-09-26 ENCOUNTER — Ambulatory Visit (INDEPENDENT_AMBULATORY_CARE_PROVIDER_SITE_OTHER): Admitting: Family Medicine

## 2023-09-26 ENCOUNTER — Ambulatory Visit: Admitting: Family Medicine

## 2023-09-26 ENCOUNTER — Ambulatory Visit: Payer: Self-pay

## 2023-09-26 ENCOUNTER — Encounter: Payer: Self-pay | Admitting: Family Medicine

## 2023-09-26 VITALS — BP 128/72 | HR 66 | Temp 97.8°F | Resp 18 | Ht 69.0 in | Wt 172.4 lb

## 2023-09-26 DIAGNOSIS — M25511 Pain in right shoulder: Secondary | ICD-10-CM

## 2023-09-26 DIAGNOSIS — R102 Pelvic and perineal pain: Secondary | ICD-10-CM | POA: Diagnosis not present

## 2023-09-26 DIAGNOSIS — S2231XD Fracture of one rib, right side, subsequent encounter for fracture with routine healing: Secondary | ICD-10-CM | POA: Diagnosis not present

## 2023-09-26 MED ORDER — CELECOXIB 200 MG PO CAPS: 200.0000 mg | ORAL_CAPSULE | Freq: Two times a day (BID) | ORAL | 0 refills | Status: AC
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
## 2023-09-26 NOTE — Assessment & Plan Note (Signed)
 Pelvic contusion with swelling and bruising. Previous US  was negative. Conservative management recommended. - Advise ice and heat therapy.

## 2023-09-26 NOTE — Assessment & Plan Note (Signed)
 Displaced right posterior lateral second rib fracture. Severe pain impacts daily activities. Healing typically takes 6-8 weeks, it has been nearly 3 weeks. Persistent pain from fractured rib. Pain management limited by narcotic use history. - Refer to pain management clinic. - Encourage lidocaine patches, ice, CBD cream. - Advise bracing techniques for comfort. - work note generated, out of work til Thursday.

## 2023-09-27 ENCOUNTER — Telehealth: Payer: Self-pay

## 2023-09-27 ENCOUNTER — Inpatient Hospital Stay: Admitting: Physician Assistant

## 2023-09-27 NOTE — Telephone Encounter (Signed)
 Copied from CRM [id number]. Topic: General - Other >> Sep 27, 2023 10:28 AM Charlet HERO wrote: Reason for CRM: Patient is calling to have the letter that was given to him on yesterday for work could be faxed to his place of emplyoment patient did not have fax number will call back. >> Sep 27, 2023 10:36 AM Rosaria E wrote: Pt called to report that he was unable to retrieve a fax number however he has a phone/extension  Phone: [phone] extension [phone] Or [phone]

## 2023-09-29 NOTE — Telephone Encounter (Addendum)
 Patient is calling to check on the status on the return back to work note. Please advise CB- [phone]

## 2023-10-03 ENCOUNTER — Ambulatory Visit (INDEPENDENT_AMBULATORY_CARE_PROVIDER_SITE_OTHER)

## 2023-10-03 VITALS — BP 110/68 | HR 89 | Temp 97.3°F | Ht 69.0 in | Wt 167.8 lb

## 2023-10-03 DIAGNOSIS — S2231XD Fracture of one rib, right side, subsequent encounter for fracture with routine healing: Secondary | ICD-10-CM | POA: Diagnosis not present

## 2023-10-03 DIAGNOSIS — F418 Other specified anxiety disorders: Secondary | ICD-10-CM

## 2023-10-03 DIAGNOSIS — F419 Anxiety disorder, unspecified: Secondary | ICD-10-CM

## 2023-10-03 MED ORDER — SERTRALINE HCL 50 MG PO TABS: 50.0000 mg | ORAL_TABLET | Freq: Every day | ORAL | 0 refills | Status: AC

## 2023-10-03 NOTE — Progress Notes (Unsigned)
 Subjective:  Patient ID: Caleb Frye, male    DOB: 08-15-1985  Age: 38 y.o. MRN: [id number]  Chief Complaint  Patient presents with  . note for work.    HPI:     09/11/2023    4:36 PM 12/09/2021    3:16 PM 04/25/2017    2:33 PM  Depression screen PHQ 2/9  Decreased Interest 3 0 0  Down, Depressed, Hopeless 1 2 1   PHQ - 2 Score 4 2 1   Altered sleeping 0 3   Tired, decreased energy 0 3   Change in appetite 0 3   Feeling bad or failure about yourself  0 3   Trouble concentrating 3 3   Moving slowly or fidgety/restless 0 3   Suicidal thoughts 0 0   PHQ-9 Score 7 20   Difficult doing work/chores Not difficult at all Very difficult         09/11/2023    4:36 PM  Fall Risk   Falls in the past year? 1  Number falls in past yr: 0  Injury with Fall? 1  Risk for fall due to : Impaired mobility  Follow up Falls prevention discussed    Patient Care Team: Reggie Welge, MD as PCP - General (Family Medicine)   Review of Systems  Constitutional:  Negative for chills, fatigue and fever.  HENT:  Negative for congestion, ear pain, sinus pressure and sore throat.   Respiratory:  Negative for cough and shortness of breath.   Cardiovascular:  Negative for chest pain.  Gastrointestinal:  Negative for abdominal pain, constipation, diarrhea, nausea and vomiting.  Genitourinary:  Negative for dysuria and frequency.  Musculoskeletal:  Negative for arthralgias, back pain and myalgias.  Neurological:  Negative for dizziness and headaches.  Psychiatric/Behavioral:  Negative for dysphoric mood. The patient is not nervous/anxious.     Current Outpatient Medications on File Prior to Visit  Medication Sig Dispense Refill  . celecoxib  (CELEBREX ) 200 MG capsule Take 1 capsule (200 mg total) by mouth 2 (two) times daily. 60 capsule 0  . oxyCODONE  (OXY IR/ROXICODONE ) 5 MG immediate release tablet Take 1 tablet (5 mg total) by mouth every 4 (four) hours as needed. 30 tablet 0  . sertraline   (ZOLOFT ) 25 MG tablet Take 1 tablet (25 mg total) by mouth daily. 90 tablet 1  . losartan  (COZAAR ) 25 MG tablet Take 1 tablet (25 mg total) by mouth daily. (Patient not taking: Reported on 10/03/2023) 90 tablet 1   No current facility-administered medications on file prior to visit.   History reviewed. No pertinent past medical history. History reviewed. No pertinent surgical history.  Family History  Problem Relation Age of Onset  . Hypertension Mother   . Diabetes Mother   . Bipolar disorder Mother   . Hypertension Father    Social History   Socioeconomic History  . Marital status: Single    Spouse name: Not on file  . Number of children: 0  . Years of education: 44  . Highest education level: Not on file  Occupational History  . Not on file  Tobacco Use  . Smoking status: Never  . Smokeless tobacco: Never  Vaping Use  . Vaping status: Former  Substance and Sexual Activity  . Alcohol use: No  . Drug use: Yes    Frequency: 7.0 times per week    Types: Marijuana  . Sexual activity: Yes    Comment: given condoms  Other Topics Concern  . Not on file  Social  History Narrative  . Not on file   Social Drivers of Health   Financial Resource Strain: Not on file  Food Insecurity: Not on file  Transportation Needs: Not on file  Physical Activity: Not on file  Stress: Not on file  Social Connections: Not on file    Objective:  BP 110/68   Pulse 89   Temp (!) 97.3 F (36.3 C)   Ht 5' 9 (1.753 m)   Wt 167 lb 12.8 oz (76.1 kg)   SpO2 98%   BMI 24.78 kg/m      10/03/2023    3:37 PM 09/26/2023   11:03 AM 09/18/2023    9:59 AM  BP/Weight  Systolic BP 110 128 138  Diastolic BP 68 72 78  Wt. (Lbs) 167.8 172.4 177.8  BMI 24.78 kg/m2 25.46 kg/m2 26.26 kg/m2    Physical Exam  {Perform Simple Foot Exam  Perform Detailed exam:1} {Insert foot Exam (Optional):30965}   Lab Results  Component Value Date   GLUCOSE 103 (H) 12/09/2021   ALT 84 (H) 12/09/2021   AST  50 (H) 12/09/2021   NA 141 12/09/2021   K 3.5 12/09/2021   CL 101 12/09/2021   CREATININE 1.00 12/09/2021   BUN 14 12/09/2021   CO2 24 12/09/2021   INR 1.0 04/25/2017      Assessment & Plan:  There are no diagnoses linked to this encounter.   No orders of the defined types were placed in this encounter.   No orders of the defined types were placed in this encounter.    Follow-up: No follow-ups on file.    An After Visit Summary was printed and given to the patient.  Chee Kinslow, MD [facility] [phone]
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
## 2023-10-05 NOTE — Assessment & Plan Note (Signed)
 Ongoing anxiety symptoms. Currently on sertraline  25 MG (Zoloft ) but reports persistent anxiety symptoms. Concerns about previous pain medication affecting behavior, leading to discontinuation. No benzodiazepines prescribed due to potential risks. Vistaril  previously prescribed for anxiety symptoms. - Increase sertraline  (Zoloft ) dose to 50 mg daily. - Continue Vistaril  as needed for anxiety symptoms.  I WOULD NOT CONSIDER BENZODIAZEPINES DUE TO HIS SUBSTANCE USE DISORDER

## 2023-10-10 ENCOUNTER — Ambulatory Visit: Admitting: Physician Assistant

## 2023-10-12 ENCOUNTER — Encounter: Payer: Self-pay | Admitting: Physical Medicine & Rehabilitation

## 2023-10-16 ENCOUNTER — Telehealth: Payer: Self-pay

## 2023-10-16 NOTE — Telephone Encounter (Unsigned)
 Copied from CRM [id number]. Topic: General - Other >> Oct 16, 2023  1:22 PM Caleb Frye wrote: Reason for CRM: Pt can be contacted via MyChart regarding written excuse for employer on days of 8/7,01/2024. >> Oct 16, 2023  1:34 PM Caleb Frye wrote: Please email employer at [email or web address] and sent to pt's MyChart.

## 2023-10-16 NOTE — Telephone Encounter (Unsigned)
 Copied from CRM [id number]. Topic: General - Other >> Oct 13, 2023  2:01 PM Pinkey ORN wrote: Reason for CRM: Physician Statement Form >> Oct 13, 2023  2:04 PM Pinkey ORN wrote: Patient is requesting to have his physician statement faxed over to [phone]. Patient states this is apart of his [facility] and needs it completed urgently. Patient is requesting a message via mychart once completed.

## 2023-10-16 NOTE — Telephone Encounter (Signed)
 Copied from CRM #[id number]. Topic: Medical Record Request - Other >> Oct 16, 2023  9:11 AM Cleave MATSU wrote: Reason for CRM: pt dropped off authorization forms and need them signed and emailed to [email or web address] >> Oct 16, 2023  1:30 PM Zebedee SAUNDERS wrote: Pt will dropped off authorization forms and need them signed and emailed to [email or web address] Please contact pt via MyChart when they are ready for pick up.

## 2023-10-24 ENCOUNTER — Ambulatory Visit

## 2023-10-25 ENCOUNTER — Ambulatory Visit

## 2023-12-05 ENCOUNTER — Ambulatory Visit: Payer: Self-pay

## 2023-12-06 ENCOUNTER — Encounter: Attending: Physical Medicine & Rehabilitation | Admitting: Physical Medicine & Rehabilitation
